# Patient Record
Sex: Female | Born: 2000
Health system: Southern US, Community
[De-identification: ages and names within clinical notes are randomized; demographics above are authoritative.]

## PROBLEM LIST (undated history)

## (undated) DIAGNOSIS — M419 Scoliosis, unspecified: Secondary | ICD-10-CM

## (undated) DIAGNOSIS — J302 Other seasonal allergic rhinitis: Secondary | ICD-10-CM

## (undated) DIAGNOSIS — R011 Cardiac murmur, unspecified: Secondary | ICD-10-CM

## (undated) DIAGNOSIS — J45909 Unspecified asthma, uncomplicated: Secondary | ICD-10-CM

## (undated) HISTORY — DX: Scoliosis, unspecified: M41.9

## (undated) HISTORY — DX: Unspecified asthma, uncomplicated: J45.909

## (undated) HISTORY — DX: Cardiac murmur, unspecified: R01.1

---
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
## 2018-10-11 ENCOUNTER — Other Ambulatory Visit: Payer: Self-pay

## 2018-10-11 ENCOUNTER — Ambulatory Visit (INDEPENDENT_AMBULATORY_CARE_PROVIDER_SITE_OTHER): Payer: PRIVATE HEALTH INSURANCE | Admitting: Family

## 2018-10-11 ENCOUNTER — Ambulatory Visit (INDEPENDENT_AMBULATORY_CARE_PROVIDER_SITE_OTHER)
Admission: RE | Admit: 2018-10-11 | Discharge: 2018-10-11 | Disposition: A | Discharge status: Home / Self Care | Payer: PRIVATE HEALTH INSURANCE | Source: Ambulatory Visit | Attending: Family | Admitting: Family

## 2018-10-11 ENCOUNTER — Other Ambulatory Visit (INDEPENDENT_AMBULATORY_CARE_PROVIDER_SITE_OTHER): Payer: PRIVATE HEALTH INSURANCE

## 2018-10-11 ENCOUNTER — Encounter: Payer: Self-pay | Admitting: Family

## 2018-10-11 VITALS — BP 118/80 | HR 86 | Temp 98.5°F | Ht 66.1 in | Wt 159.0 lb

## 2018-10-11 DIAGNOSIS — M419 Scoliosis, unspecified: Secondary | ICD-10-CM

## 2018-10-11 DIAGNOSIS — Z13220 Encounter for screening for lipoid disorders: Secondary | ICD-10-CM

## 2018-10-11 DIAGNOSIS — Z113 Encounter for screening for infections with a predominantly sexual mode of transmission: Secondary | ICD-10-CM

## 2018-10-11 DIAGNOSIS — Z0000 Encounter for general adult medical examination without abnormal findings: Secondary | ICD-10-CM

## 2018-10-11 LAB — LIPID PANEL
Cholesterol: 186 mg/dL (ref 0–200)
HDL: 61.1 mg/dL (ref >39.00)
LDL Cholesterol: 116 mg/dL — ABNORMAL HIGH (ref 0–99)
NonHDL: 124.83
Total CHOL/HDL Ratio: 3
Triglycerides: 43 mg/dL (ref 0.0–149.0)
VLDL: 8.6 mg/dL (ref 0.0–40.0)

## 2018-10-11 LAB — COMPREHENSIVE METABOLIC PANEL
ALT: 12 U/L (ref 0–35)
AST: 11 U/L (ref 0–37)
Albumin: 4.9 g/dL (ref 3.5–5.2)
Alkaline Phosphatase: 19 U/L — ABNORMAL LOW (ref 47–119)
BUN: 16 mg/dL (ref 6–23)
CO2: 26 mEq/L (ref 19–32)
Calcium: 9.9 mg/dL (ref 8.4–10.5)
Chloride: 104 mEq/L (ref 96–112)
Creatinine, Ser: 0.88 mg/dL (ref 0.40–1.20)
GFR: 99.25 mL/min (ref >60.00)
Glucose, Bld: 88 mg/dL (ref 70–99)
Potassium: 4.1 mEq/L (ref 3.5–5.1)
Sodium: 138 mEq/L (ref 135–145)
Total Bilirubin: 0.5 mg/dL (ref 0.2–1.2)
Total Protein: 7.7 g/dL (ref 6.0–8.3)

## 2018-10-11 LAB — CBC WITH DIFFERENTIAL/PLATELET
Basophils Absolute: 0 10*3/uL (ref 0.0–0.1)
Basophils Relative: 0.4 % (ref 0.0–3.0)
Eosinophils Absolute: 0.1 10*3/uL (ref 0.0–0.7)
Eosinophils Relative: 0.9 % (ref 0.0–5.0)
HCT: 38.4 % (ref 36.0–49.0)
Hemoglobin: 12.4 g/dL (ref 12.0–16.0)
Lymphocytes Relative: 36.2 % (ref 24.0–48.0)
Lymphs Abs: 2.2 10*3/uL (ref 0.7–4.0)
MCHC: 32.4 g/dL (ref 31.0–37.0)
MCV: 87.7 fl (ref 78.0–98.0)
Monocytes Absolute: 0.3 10*3/uL (ref 0.1–1.0)
Monocytes Relative: 5.1 % (ref 3.0–12.0)
Neutro Abs: 3.5 10*3/uL (ref 1.4–7.7)
Neutrophils Relative %: 57.4 % (ref 43.0–71.0)
Platelets: 300 10*3/uL (ref 150.0–575.0)
RBC: 4.38 Mil/uL (ref 3.80–5.70)
RDW: 14.1 % (ref 11.4–15.5)
WBC: 6 10*3/uL (ref 4.5–13.5)

## 2018-10-11 LAB — VITAMIN D 25 HYDROXY (VIT D DEFICIENCY, FRACTURES): VITD: 28.75 ng/mL — ABNORMAL LOW (ref 30.00–100.00)

## 2018-10-11 LAB — TSH: TSH: 1.36 u[IU]/mL (ref 0.40–5.00)

## 2018-10-11 MED ORDER — ALBUTEROL SULFATE HFA 108 (90 BASE) MCG/ACT IN AERS: 2.0000 | INHALATION_SPRAY | Freq: Four times a day (QID) | RESPIRATORY_TRACT | 1 refills | Status: DC | PRN

## 2018-10-11 MED ORDER — FLUTICASONE PROPIONATE 50 MCG/ACT NA SUSP: 2.0000 | Freq: Every day | NASAL | 11 refills | Status: AC

## 2018-10-11 NOTE — Progress Notes (Signed)
Rachel Fernandez is a 18 y.o. female with the following history as recorded in EpicCare:  Patient Active Problem List   Diagnosis Date Noted  . Scoliosis 10/11/2018  . Upper respiratory tract infection 12/29/2016    Current Outpatient Medications  Medication Sig Dispense Refill  . fluticasone (FLONASE) 50 MCG/ACT nasal spray Place 2 sprays into both nostrils daily. 16 g 11  . ibuprofen (ADVIL,MOTRIN) 200 MG tablet Take 600 mg by mouth every 6 (six) hours as needed for mild pain.    . Levonorgestrel 19.5 MG IUD by Intrauterine route.    Marland Kitchen albuterol (VENTOLIN HFA) 108 (90 Base) MCG/ACT inhaler Inhale 2 puffs into the lungs every 6 (six) hours as needed for wheezing or shortness of breath. 6.7 g 1   No current facility-administered medications for this visit.     Allergies: Other  Past Medical History:  Diagnosis Date  . Asthma   . Heart murmur   . Scoliosis   . Seasonal allergies     Past Surgical History:  Procedure Laterality Date  . TONSILLECTOMY AND ADENOIDECTOMY  2003    Family History  Problem Relation Age of Onset  . Hypertension Father   . Hyperlipidemia Father   . Diabetes Maternal Grandmother     Social History   Tobacco Use  . Smoking status: Never Smoker  . Smokeless tobacco: Never Used  Substance Use Topics  . 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   HENT: Negative.   Eyes: Negative for blurred vision.  Respiratory: Negative for cough and shortness of breath.   Cardiovascular: Negative for chest pain.  Gastrointestinal: Negative for abdominal pain, constipation and diarrhea.  Genitourinary: Negative for dysuria.  Musculoskeletal: Negative for back pain.  Skin: Negative.   Neurological: Negative for dizziness and headaches.  Psychiatric/Behavioral: Negative for depression. The patient is not nervous/anxious and does not have insomnia.       Objective:  Vitals:   10/11/18 1504  BP: 118/80  Pulse: 86  Temp: 98.5 F (36.9 C)  TempSrc: Oral  SpO2: 99%  Weight: 159 lb (72.1 kg)  Height: 5' 6.1" (1.679 m)    General: Well developed, well nourished, in no acute distress  Skin : Warm and dry.  Head: Normocephalic and atraumatic  Eyes: Sclera and conjunctiva clear; pupils round and reactive to light; extraocular movements intact  Ears: External normal; canals clear; tympanic membranes normal  Oropharynx: Pink, supple. No suspicious lesions  Neck: Supple without thyromegaly, adenopathy  Lungs: Respirations unlabored; clear to auscultation bilaterally without wheeze, rales, rhonchi  CVS exam: normal rate, regular rhythm, normal S1, S2, no murmurs, rubs, clicks or gallops.  Abdomen: Soft; nontender; nondistended; normoactive bowel sounds; no masses or hepatosplenomegaly  Musculoskeletal: No deformities; no active joint inflammation  Extremities: No edema, cyanosis, clubbing  Vessels: Symmetric bilaterally  Neurologic: Alert and oriented; speech intact; face symmetrical; moves all extremities well; CNII-XII intact without focal deficit  Assessment:  1. PE (physical exam), annual   2. Scoliosis, unspecified scoliosis type, unspecified spinal region   3. Lipid screening   4. Screen for STD (sexually transmitted disease)     Plan:  Age  appropriate preventive healthcare needs addressed; encouraged regular eye doctor and dental exams; encouraged regular exercise; will update labs and refills as needed today; follow-up to be determined;   No follow-ups on file.  Orders Placed This Encounter  Procedures  . GC/Chlamydia Probe Amp([facility])    Standing Status:   Future    Number of Occurrences:   1    Standing Expiration Date:   10/11/2019  . DG Lumbar Spine 2-3 Views    Standing Status:   Future    Number of Occurrences:   1    Standing Expiration Date:   12/12/2019    Order Specific Question:   Reason for Exam (SYMPTOM  OR DIAGNOSIS REQUIRED)    Answer:   scoliosis    Order Specific Question:   Is patient pregnant?    Answer:   No    Order Specific Question:   Preferred imaging location?    Answer:   [facility]    Order Specific Question:   Radiology Contrast Protocol - do NOT remove file path    Answer:   \\[email or web address].pdf  . DG Thoracic Spine 2 View    Standing Status:   Future    Number of Occurrences:   1    Standing Expiration Date:   12/12/2019    Order Specific Question:   Reason for Exam (SYMPTOM  OR DIAGNOSIS REQUIRED)    Answer:   scoliosis    Order Specific Question:   Is patient pregnant?    Answer:   No    Order Specific Question:   Preferred imaging location?    Answer:   [facility]    Order Specific Question:   Radiology Contrast Protocol - do NOT remove file path    Answer:   \\[email or web address]\DXFluoroContrastProtocols.pdf  . CBC w/Diff    Standing Status:   Future    Number of Occurrences:   1    Standing Expiration Date:   10/11/2019  . Comp Met (CMET)    Standing Status:   Future    Number of Occurrences:   1    Standing Expiration Date:   10/11/2019  . Lipid panel    Standing Status:   Future    Number of Occurrences:   1    Standing Expiration Date:   10/11/2019  . TSH    Standing Status:   Future    Number of Occurrences:   1     Standing Expiration Date:   10/11/2019  . Vitamin D (25 hydroxy)    Standing Status:   Future    Number of Occurrences:   1    Standing Expiration Date:   10/11/2019  . HIV Antibody (routine testing w rflx)    Standing Status:   Future    Number of Occurrences:   1    Standing Expiration Date:   10/11/2019  . RPR    Standing Status:   Future    Number of Occurrences:   1    Standing Expiration Date:   10/11/2019    Requested Prescriptions   Signed Prescriptions Disp Refills  . albuterol (VENTOLIN HFA) 108 (90  Base) MCG/ACT inhaler 6.7 g 1    Sig: Inhale 2 puffs into the lungs every 6 (six) hours as needed for wheezing or shortness of breath.  . fluticasone (FLONASE) 50 MCG/ACT nasal spray 16 g 11    Sig: Place 2 sprays into both nostrils daily.

## 2018-10-12 LAB — RPR: RPR Ser Ql: NONREACTIVE

## 2018-10-12 LAB — HIV ANTIBODY (ROUTINE TESTING W REFLEX): HIV 1&2 Ab, 4th Generation: NONREACTIVE

## 2018-10-14 LAB — GC/CHLAMYDIA PROBE AMP
Chlamydia trachomatis, NAA: NEGATIVE
Neisseria Gonorrhoeae by PCR: NEGATIVE

## 2019-01-06 ENCOUNTER — Telehealth: Payer: Self-pay

## 2019-01-06 NOTE — Telephone Encounter (Signed)
Copied from [place] [phone]. Topic: General - Inquiry >> Jan 06, 2019  9:24 AM Rachel Fernandez, NT wrote: Reason for CRM: Patient called in stating she is needing to have a TB test done as she is needing it for her new job. Patient would also like to know the cost of the test. Please advise. >> Jan 06, 2019  9:28 AM Morphies, Isidoro Donning wrote: Faythe Ghee to schedule?

## 2019-01-06 NOTE — Telephone Encounter (Signed)
Pt has decided to go somewhere else for the TB Test.

## 2019-01-11 ENCOUNTER — Telehealth: Payer: Self-pay

## 2019-01-11 NOTE — Telephone Encounter (Signed)
Spoke with patient today. She is going to have form refaxed to [place] and pick it up once it is completed and ready.

## 2019-01-11 NOTE — Telephone Encounter (Signed)
Copied from [place] [phone]. Topic: General - Inquiry >> Jan 11, 2019  9:40 AM Percell Belt A wrote: Reason for CRM: pt called in and stated that there was a form faxed over called a staff medical report last week.  She would like to know if this has been completed or was this rec'd?   Best number  [phone]

## 2019-01-12 NOTE — Telephone Encounter (Signed)
Form received and placed on Laura's desk to complete.

## 2019-01-12 NOTE — Telephone Encounter (Signed)
Called and left message for patient today that form had been faxed. Mailed out original copy to her today.

## 2019-01-12 NOTE — Telephone Encounter (Signed)
Patient calling and states that she received a call that this form was ready for pick up. Would like to know if this could be faxed? Please advise. Fax#: [phone] Attn Nunzio Cory

## 2019-01-14 ENCOUNTER — Ambulatory Visit (LOCAL_COMMUNITY_HEALTH_CENTER): Payer: Self-pay

## 2019-01-14 ENCOUNTER — Other Ambulatory Visit: Payer: Self-pay

## 2019-01-14 DIAGNOSIS — Z111 Encounter for screening for respiratory tuberculosis: Secondary | ICD-10-CM

## 2019-01-17 ENCOUNTER — Ambulatory Visit (LOCAL_COMMUNITY_HEALTH_CENTER): Payer: Self-pay

## 2019-01-17 ENCOUNTER — Other Ambulatory Visit: Payer: Self-pay

## 2019-01-17 DIAGNOSIS — Z111 Encounter for screening for respiratory tuberculosis: Secondary | ICD-10-CM

## 2019-01-17 LAB — TB SKIN TEST
Induration: 0 mm
TB Skin Test: NEGATIVE

## 2019-05-26 ENCOUNTER — Ambulatory Visit: Payer: PRIVATE HEALTH INSURANCE | Attending: Internal Medicine

## 2019-05-26 DIAGNOSIS — Z23 Encounter for immunization: Secondary | ICD-10-CM

## 2019-05-26 NOTE — Progress Notes (Signed)
   Covid-19 Vaccination Clinic  Name:  Rachel Fernandez    MRN: [id number] DOB: 12/25/2000  05/26/2019  [name] was observed post Covid-19 immunization for 15 minutes without incidence. She was provided with Vaccine Information Sheet and instruction to access the V-Safe system.   [name] was instructed to call 911 with any severe reactions post vaccine: Marland Kitchen Difficulty breathing  . Swelling of your face and throat  . A fast heartbeat  . A bad rash all over your body  . Dizziness and weakness    Immunizations Administered    Name Date Dose VIS Date Route   Pfizer COVID-19 Vaccine 05/26/2019  4:15 PM 0.3 mL 03/11/2019 Intramuscular   Manufacturer: [employer], [employer]   Lot: [id number]   NDC: [address]-8

## 2019-06-22 ENCOUNTER — Ambulatory Visit: Payer: PRIVATE HEALTH INSURANCE | Attending: Internal Medicine

## 2019-06-22 DIAGNOSIS — Z23 Encounter for immunization: Secondary | ICD-10-CM

## 2019-06-22 NOTE — Progress Notes (Signed)
   Covid-19 Vaccination Clinic  Name:  Rachel Fernandez    MRN: [id number] DOB: [birth year]  06/22/2019  Ms. Bamburg was observed post Covid-19 immunization for 15 minutes without incident. She was provided with Vaccine Information Sheet and instruction to access the V-Safe system.   Ms. Fouche was instructed to call 911 with any severe reactions post vaccine: Marland Kitchen Difficulty breathing  . Swelling of face and throat  . A fast heartbeat  . A bad rash all over body  . Dizziness and weakness   Immunizations Administered    Name Date Dose VIS Date Route   Pfizer COVID-19 Vaccine 06/22/2019  1:18 PM 0.3 mL 03/11/2019 Intramuscular   Manufacturer: [employer], [employer]   Lot: [id number]   NDC: [address]-5
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
## 2019-10-31 ENCOUNTER — Telehealth: Payer: Self-pay

## 2019-10-31 NOTE — Telephone Encounter (Signed)
Patient appears to be over 16 so patient can manage her own requests. It is unclear if patient wants to transfer.

## 2019-10-31 NOTE — Telephone Encounter (Signed)
New message    The patient's mother is asking can Dr.Crawford accept her daughter as a TOC from [facility].   Please advise

## 2019-11-04 ENCOUNTER — Telehealth: Payer: Self-pay

## 2019-11-04 NOTE — Telephone Encounter (Signed)
New message    Call and spoke with patient mom aware of Dr. Okey Dupre recommendation mom verbalized understanding.

## 2019-11-07 ENCOUNTER — Encounter: Payer: PRIVATE HEALTH INSURANCE | Admitting: Family

## 2019-11-14 ENCOUNTER — Encounter: Payer: Self-pay | Admitting: Family

## 2019-11-14 ENCOUNTER — Other Ambulatory Visit: Payer: Self-pay

## 2019-11-14 ENCOUNTER — Ambulatory Visit (INDEPENDENT_AMBULATORY_CARE_PROVIDER_SITE_OTHER): Payer: BC Managed Care – PPO | Admitting: Family

## 2019-11-14 VITALS — BP 122/76 | HR 84 | Temp 98.2°F | Ht 66.1 in | Wt 169.0 lb

## 2019-11-14 DIAGNOSIS — Z23 Encounter for immunization: Secondary | ICD-10-CM

## 2019-11-14 DIAGNOSIS — Z13220 Encounter for screening for lipoid disorders: Secondary | ICD-10-CM | POA: Diagnosis not present

## 2019-11-14 DIAGNOSIS — Z0000 Encounter for general adult medical examination without abnormal findings: Secondary | ICD-10-CM | POA: Diagnosis not present

## 2019-11-14 MED ORDER — LORATADINE 10 MG PO TABS: 10.0000 mg | ORAL_TABLET | Freq: Every day | ORAL | 11 refills | Status: AC

## 2019-11-14 MED ORDER — ALBUTEROL SULFATE HFA 108 (90 BASE) MCG/ACT IN AERS: 2.0000 | INHALATION_SPRAY | Freq: Four times a day (QID) | RESPIRATORY_TRACT | 1 refills | Status: DC | PRN

## 2019-11-14 NOTE — Progress Notes (Signed)
Rachel Fernandez is a 19 y.o. female with the following history as recorded in [employer]:  Patient Active Problem List   Diagnosis Date Noted  . Scoliosis 10/11/2018  . Upper respiratory tract infection 12/29/2016    Current Outpatient Medications  Medication Sig Dispense Refill  . albuterol (VENTOLIN HFA) 108 (90 Base) MCG/ACT inhaler Inhale 2 puffs into the lungs every 6 (six) hours as needed for wheezing or shortness of breath. 6.7 g 1  . ibuprofen (ADVIL,MOTRIN) 200 MG tablet Take 600 mg by mouth every 6 (six) hours as needed for mild pain.    . Levonorgestrel 19.5 MG IUD by Intrauterine route.    . fluticasone (FLONASE) 50 MCG/ACT nasal spray Place 2 sprays into both nostrils daily. (Patient not taking: Reported on 11/14/2019) 16 g 11  . loratadine (CLARITIN) 10 MG tablet Take 1 tablet (10 mg total) by mouth daily. 30 tablet 11   No current facility-administered medications for this visit.    Allergies: Other  Past Medical History:  Diagnosis Date  . Asthma   . Heart murmur   . Scoliosis   . Seasonal allergies     Past Surgical History:  Procedure Laterality Date  . TONSILLECTOMY AND ADENOIDECTOMY  2003    Family History  Problem Relation Age of Onset  . Hypertension Father   . Hyperlipidemia Father   . Diabetes Maternal Grandmother     Social History   Tobacco Use  . Smoking status: Never Smoker  . Smokeless tobacco: Never Used  Substance Use Topics  . Alcohol use: No    Alcohol/week: 0.0 standard drinks    Subjective:  Presents for yearly CPE; in baseline state of health; scheduled to see her GYN later this year; Working on [employer] in [employer]; currently working as Educational psychologist;  Review of Systems  Constitutional: Negative.   HENT: Negative.   Eyes: Negative.   Respiratory: Negative.   Cardiovascular: Negative.   Gastrointestinal: Negative.   Genitourinary: Negative.   Musculoskeletal: Negative.   Skin: Negative.   Neurological: Negative.    Endo/Heme/Allergies: Negative.   Psychiatric/Behavioral: Negative.       Objective:  Vitals:   11/14/19 1543  BP: 122/76  Pulse: 84  Temp: 98.2 F (36.8 C)  TempSrc: Oral  SpO2: 99%  Weight: 169 lb (76.7 kg)  Height: 5' 6.1" (1.679 m)    General: Well developed, well nourished, in no acute distress  Skin : Warm and dry.  Head: Normocephalic and atraumatic  Eyes: Sclera and conjunctiva clear; pupils round and reactive to light; extraocular movements intact  Ears: External normal; canals clear; tympanic membranes normal  Oropharynx: Pink, supple. No suspicious lesions  Neck: Supple without thyromegaly, adenopathy  Lungs: Respirations unlabored; clear to auscultation bilaterally without wheeze, rales, rhonchi  CVS exam: normal rate and regular rhythm.  Abdomen: Soft; nontender; nondistended; normoactive bowel sounds; no masses or hepatosplenomegaly  Musculoskeletal: No deformities; no active joint inflammation  Extremities: No edema, cyanosis, clubbing  Vessels: Symmetric bilaterally  Neurologic: Alert and oriented; speech intact; face symmetrical; moves all extremities well; CNII-XII intact without focal deficit   Assessment:  1. PE (physical exam), annual   2. Lipid screening     Plan:  Age appropriate preventive healthcare needs addressed; encouraged regular eye doctor and dental exams; encouraged regular exercise; will update labs and refills as needed today; follow-up to be determined; Tdap updated;   No follow-ups on file.  Orders Placed This Encounter  Procedures  . CBC with Differential/Platelet  Standing Status:   Future    Standing Expiration Date:   11/13/2020  . Fernandez Met (CMET)    Standing Status:   Future    Standing Expiration Date:   11/13/2020  . Lipid panel    Standing Status:   Future    Standing Expiration Date:   11/13/2020  . TSH    Standing Status:   Future    Standing Expiration Date:   11/13/2020  . HIV antibody (with reflex)    Standing  Status:   Future    Standing Expiration Date:   11/13/2020  . GC probe amplification, urine    Standing Status:   Future    Standing Expiration Date:   11/13/2020  . RPR    Standing Status:   Future    Standing Expiration Date:   11/13/2020    Requested Prescriptions   Signed Prescriptions Disp Refills  . albuterol (VENTOLIN HFA) 108 (90 Base) MCG/ACT inhaler 6.7 g 1    Sig: Inhale 2 puffs into the lungs every 6 (six) hours as needed for wheezing or shortness of breath.  . loratadine (CLARITIN) 10 MG tablet 30 tablet 11    Sig: Take 1 tablet (10 mg total) by mouth daily.

## 2019-11-15 LAB — CBC WITH DIFFERENTIAL/PLATELET
Absolute Monocytes: 291 cells/uL (ref 200–950)
Basophils Absolute: 19 cells/uL (ref 0–200)
Basophils Relative: 0.3 %
Eosinophils Absolute: 143 cells/uL (ref 15–500)
Eosinophils Relative: 2.3 %
HCT: 36.9 % (ref 35.0–45.0)
Hemoglobin: 12.2 g/dL (ref 11.7–15.5)
Lymphs Abs: 2920 cells/uL (ref 850–3900)
MCH: 29 pg (ref 27.0–33.0)
MCHC: 33.1 g/dL (ref 32.0–36.0)
MCV: 87.9 fL (ref 80.0–100.0)
MPV: 11.6 fL (ref 7.5–12.5)
Monocytes Relative: 4.7 %
Neutro Abs: 2827 cells/uL (ref 1500–7800)
Neutrophils Relative %: 45.6 %
Platelets: 282 10*3/uL (ref 140–400)
RBC: 4.2 10*6/uL (ref 3.80–5.10)
RDW: 13.6 % (ref 11.0–15.0)
Total Lymphocyte: 47.1 %
WBC: 6.2 10*3/uL (ref 3.8–10.8)

## 2019-11-15 LAB — LIPID PANEL
Cholesterol: 190 mg/dL (ref <200)
HDL: 58 mg/dL (ref >=50)
LDL Cholesterol (Calc): 117 mg/dL (calc) — ABNORMAL HIGH
Non-HDL Cholesterol (Calc): 132 mg/dL (calc) — ABNORMAL HIGH (ref <130)
Total CHOL/HDL Ratio: 3.3 (calc) (ref <5.0)
Triglycerides: 63 mg/dL (ref <150)

## 2019-11-15 LAB — COMPREHENSIVE METABOLIC PANEL
AG Ratio: 1.8 (calc) (ref 1.0–2.5)
ALT: 11 U/L (ref 6–29)
AST: 13 U/L (ref 10–30)
Albumin: 4.7 g/dL (ref 3.6–5.1)
Alkaline phosphatase (APISO): 19 U/L — ABNORMAL LOW (ref 31–125)
BUN: 16 mg/dL (ref 7–25)
CO2: 26 mmol/L (ref 20–32)
Calcium: 10 mg/dL (ref 8.6–10.2)
Chloride: 103 mmol/L (ref 98–110)
Creat: 0.83 mg/dL (ref 0.50–1.10)
Globulin: 2.6 g/dL (calc) (ref 1.9–3.7)
Glucose, Bld: 89 mg/dL (ref 65–99)
Potassium: 4.2 mmol/L (ref 3.5–5.3)
Sodium: 137 mmol/L (ref 135–146)
Total Bilirubin: 0.4 mg/dL (ref 0.2–1.2)
Total Protein: 7.3 g/dL (ref 6.1–8.1)

## 2019-11-15 LAB — RPR: RPR Ser Ql: NONREACTIVE

## 2019-11-15 LAB — TSH: TSH: 1.38 mIU/L

## 2019-11-15 LAB — HIV ANTIBODY (ROUTINE TESTING W REFLEX): HIV 1&2 Ab, 4th Generation: NONREACTIVE

## 2019-11-16 LAB — GC/CHLAMYDIA PROBE AMP

## 2019-11-16 LAB — SPECIMEN STATUS REPORT

## 2019-11-30 DIAGNOSIS — Z01419 Encounter for gynecological examination (general) (routine) without abnormal findings: Secondary | ICD-10-CM | POA: Diagnosis not present

## 2019-11-30 DIAGNOSIS — Z6825 Body mass index (BMI) 25.0-25.9, adult: Secondary | ICD-10-CM | POA: Diagnosis not present

## 2023-12-15 ENCOUNTER — Ambulatory Visit: Admitting: Physician Assistant

## 2024-03-08 ENCOUNTER — Ambulatory Visit: Admitting: Dermatology

## 2024-03-20 ENCOUNTER — Ambulatory Visit
Admission: EM | Admit: 2024-03-20 | Discharge: 2024-03-20 | Disposition: A | Discharge status: Home / Self Care | Payer: PRIVATE HEALTH INSURANCE | Attending: Family Medicine | Admitting: Family Medicine

## 2024-03-20 DIAGNOSIS — B9789 Other viral agents as the cause of diseases classified elsewhere: Secondary | ICD-10-CM

## 2024-03-20 DIAGNOSIS — R52 Pain, unspecified: Secondary | ICD-10-CM

## 2024-03-20 DIAGNOSIS — J988 Other specified respiratory disorders: Secondary | ICD-10-CM

## 2024-03-20 DIAGNOSIS — J4521 Mild intermittent asthma with (acute) exacerbation: Secondary | ICD-10-CM

## 2024-03-20 LAB — POC COVID19/FLU A&B COMBO
Covid Antigen, POC: NEGATIVE
Influenza A Antigen, POC: NEGATIVE
Influenza B Antigen, POC: NEGATIVE

## 2024-03-20 MED ORDER — ONDANSETRON 8 MG PO TBDP: 8.0000 mg | ORAL_TABLET | Freq: Three times a day (TID) | ORAL | 0 refills | Status: AC | PRN

## 2024-03-20 MED ORDER — PROMETHAZINE-DM 6.25-15 MG/5ML PO SYRP: 5.0000 mL | ORAL_SOLUTION | Freq: Three times a day (TID) | ORAL | 0 refills | Status: AC | PRN

## 2024-03-20 MED ORDER — PREDNISONE 20 MG PO TABS: ORAL_TABLET | ORAL | 0 refills | Status: AC

## 2024-03-20 MED ORDER — ALBUTEROL SULFATE HFA 108 (90 BASE) MCG/ACT IN AERS: 1.0000 | INHALATION_SPRAY | Freq: Four times a day (QID) | RESPIRATORY_TRACT | 0 refills | Status: AC | PRN

## 2024-03-20 NOTE — ED Provider Notes (Signed)
 " [facility]  Note:  This document was prepared using Conservation officer, historic buildings and may include unintentional dictation errors.  MRN: [id number] DOB: 2000/11/16  Subjective:   Rachel Fernandez is a 23 y.o. female presenting for 4 day history of acute onset chills, sinus congestion, chest congestion, chest tightness, shortness of breath, wheezing, chest pain, productive cough, malaise and fatigue.  Has been using Mucinex.  Has allergic rhinitis and asthma.  Has not used an inhaler in a few years.  No smoking of any kind including cigarettes, cigars, vaping, marijuana use.    Current Outpatient Medications  Medication Instructions   albuterol  (VENTOLIN  HFA) 108 (90 Base) MCG/ACT inhaler 2 puffs, Inhalation, Every 6 hours PRN   fluticasone  (FLONASE ) 50 MCG/ACT nasal spray 2 sprays, Each Nare, Daily   ibuprofen  (ADVIL ) 600 mg, Oral, Every 6 hours PRN   Levonorgestrel 19.5 MG IUD Intrauterine   loratadine  (CLARITIN ) 10 mg, Oral, Daily    Allergies[1]  Past Medical History:  Diagnosis Date   Asthma    Heart murmur    Scoliosis    Seasonal allergies      Past Surgical History:  Procedure Laterality Date   TONSILLECTOMY AND ADENOIDECTOMY  2003    Family History  Problem Relation Age of Onset   Hypertension Father    Hyperlipidemia Father    Diabetes Maternal Grandmother     Social History   Occupational History   Not on file  Tobacco Use   Smoking status: Never   Smokeless tobacco: Never  Vaping Use   Vaping status: Never Used  Substance and Sexual Activity   Alcohol use: Yes    Comment: occ   Drug use: No   Sexual activity: Not Currently    Birth control/protection: None     ROS   Objective:   Vitals: BP (!) 141/85 (BP Location: Left Arm)   Pulse (!) 113   Temp 99.4 F (37.4 C) (Oral)   Resp 20   LMP 03/11/2024   SpO2 97%   Physical Exam Constitutional:      General: She is not in acute distress.    Appearance: Normal  appearance. She is well-developed and normal weight. She is not ill-appearing, toxic-appearing or diaphoretic.  HENT:     Head: Normocephalic and atraumatic.     Right Ear: Tympanic membrane, ear canal and external ear normal. No drainage or tenderness. No middle ear effusion. There is no impacted cerumen. Tympanic membrane is not erythematous or bulging.     Left Ear: Tympanic membrane, ear canal and external ear normal. No drainage or tenderness.  No middle ear effusion. There is no impacted cerumen. Tympanic membrane is not erythematous or bulging.     Nose: Congestion and rhinorrhea present.     Mouth/Throat:     Mouth: Mucous membranes are moist. No oral lesions.     Pharynx: Posterior oropharyngeal erythema (with associated thick streaks of postnasal drainage) present. No pharyngeal swelling, oropharyngeal exudate or uvula swelling.     Tonsils: No tonsillar exudate or tonsillar abscesses.  Eyes:     General: No scleral icterus.       Right eye: No discharge.        Left eye: No discharge.     Extraocular Movements: Extraocular movements intact.     Right eye: Normal extraocular motion.     Left eye: Normal extraocular motion.     Conjunctiva/sclera: Conjunctivae normal.  Cardiovascular:     Rate  and Rhythm: Normal rate and regular rhythm.     Heart sounds: Normal heart sounds. No murmur heard.    No friction rub. No gallop.  Pulmonary:     Effort: Pulmonary effort is normal. No respiratory distress.     Breath sounds: No stridor. No wheezing, rhonchi or rales.  Chest:     Chest wall: No tenderness.  Musculoskeletal:     Cervical back: Normal range of motion and neck supple.  Lymphadenopathy:     Cervical: No cervical adenopathy.  Skin:    General: Skin is warm and dry.  Neurological:     General: No focal deficit present.     Mental Status: She is alert and oriented to person, place, and time.  Psychiatric:        Mood and Affect: Mood normal.        Behavior: Behavior  normal.     Results for orders placed or performed during the hospital encounter of 03/20/24 (from the past 24 hours)  POC Covid19/Flu A&B Antigen     Status: Normal   Collection Time: 03/20/24  8:51 AM  Result Value Ref Range   Influenza A Antigen, POC Negative Negative   Influenza B Antigen, POC Negative Negative   Covid Antigen, POC Negative Negative    Assessment and Plan :   PDMP not reviewed this encounter.  1. Viral respiratory infection   2. Body aches   3. Mild intermittent asthma with (acute) exacerbation      Will defer imaging given clear pulmonary exam.  Offer prednisone  if she is having significant respiratory symptoms and.  Recommend supportive care otherwise for viral respiratory infection.  Counseled patient on potential for adverse effects with medications prescribed/recommended today, ER and return-to-clinic precautions discussed, patient verbalized understanding.     [1]  Allergies Allergen Reactions   Other Itching    Allergic to fresh fruit; itchy throat     Christopher Savannah, PA-C 03/20/24 [id number]  "

## 2024-03-20 NOTE — ED Triage Notes (Signed)
 Pt c/o cough, head/chest congestion, chills, chest tightness and SHOB-sx  x 4 days-taking mucinex-NAD-steady gait

## 2024-03-20 NOTE — Discharge Instructions (Signed)
 We will manage this as a viral respiratory infection likely worsened by your underlying asthma. For sore throat or cough try using a honey-based tea. Use 3 teaspoons of honey with juice squeezed from half lemon. Place shaved pieces of ginger into 1/2-1 cup of water and warm over stove top. Then mix the ingredients and repeat every 4 hours as needed. Please take Tylenol  500mg -650mg  once every 6 hours for fevers, aches and pains. Hydrate very well with at least 2 liters (64 ounces) of water. Eat light meals such as soups (chicken and noodles, chicken wild rice, vegetable).  Do not eat any foods that you are allergic to.  Start an antihistamine like Zyrtec (10mg  daily) for postnasal drainage, sinus congestion.  You can take this together with prednisone  and albuterol  to help with your respiratory symptoms and underlying asthma.  Use cough syrup as needed.  Ondansetron  can also help with your nausea and vomiting.
